# Patient Record
Sex: Female | Born: 1970 | Race: White | Hispanic: No | Marital: Married | Smoking: Never smoker
Health system: Southern US, Community
[De-identification: ages and names within clinical notes are randomized; demographics above are authoritative.]

---

## 2020-04-08 ENCOUNTER — Ambulatory Visit (INDEPENDENT_AMBULATORY_CARE_PROVIDER_SITE_OTHER): Payer: BC Managed Care – PPO | Admitting: Plastic Surgery

## 2020-04-08 ENCOUNTER — Other Ambulatory Visit: Payer: Self-pay

## 2020-04-08 ENCOUNTER — Ambulatory Visit
Admission: RE | Admit: 2020-04-08 | Discharge: 2020-04-08 | Disposition: A | Discharge status: Home / Self Care | Payer: BC Managed Care – PPO | Source: Ambulatory Visit | Attending: Plastic Surgery | Admitting: Plastic Surgery

## 2020-04-08 ENCOUNTER — Encounter: Payer: Self-pay | Admitting: Plastic Surgery

## 2020-04-08 VITALS — BP 125/82 | HR 76 | Temp 98.1°F | Ht 63.0 in | Wt 145.0 lb

## 2020-04-08 DIAGNOSIS — S70352A Superficial foreign body, left thigh, initial encounter: Secondary | ICD-10-CM

## 2020-04-08 NOTE — Progress Notes (Signed)
   Referring Provider No referring provider defined for this encounter.   CC:  Chief Complaint  Patient presents with  . Procedure      Anita Wilcox is an 50 y.o. female.  HPI: Patient presents with concerns of a foreign body in her left thigh.  She fell on some glass about 30 years ago and has had a number of pieces removed over time.  She feels like she has a specific area of pain in the medial thigh and wants to have it evaluated.  She says she has had x-rays done in the past which showed retained foreign bodies but they were not extracted because they have become symptomatic.  Not on File  No outpatient encounter medications on file as of 04/08/2020.   No facility-administered encounter medications on file as of 04/08/2020.     No past medical history on file.  No family history on file.  Social History   Social History Narrative  . Not on file     Review of Systems General: Denies fevers, chills, weight loss CV: Denies chest pain, shortness of breath, palpitations  Physical Exam No flowsheet data found.  General:  No acute distress,  Alert and oriented, Non-Toxic, Normal speech and affect Examination shows a scar on the posterior medial and anteromedial aspect of the thigh.  She says the glass entered and posteriorly and ultimately a piece was removed anteriorly years ago.  The focal spot of tenderness is directly between the 2 but I am unable to palpate anything obvious.  There is no visual evidence of anything in that location either.  Assessment/Plan Patient presents with suspected foreign body in the left thigh.  It seems like this is becoming symptomatic.  I would like to get an ultrasound to help localize this and define if there is more than 1 piece.  We will send her for this and then if it looks amenable to removal we will move forward with that.  All her questions were answered.  Allena Napoleon 04/08/2020, 8:44 AM

## 2020-05-23 ENCOUNTER — Telehealth: Payer: Self-pay

## 2020-05-23 NOTE — Telephone Encounter (Signed)
Patient called to say that she has had her ultrasound and she would like to know what her next step is.  Please call.

## 2020-06-06 ENCOUNTER — Other Ambulatory Visit: Payer: Self-pay

## 2020-06-06 ENCOUNTER — Other Ambulatory Visit: Payer: Self-pay | Admitting: Plastic Surgery

## 2020-06-06 DIAGNOSIS — M795 Residual foreign body in soft tissue: Secondary | ICD-10-CM

## 2020-06-10 ENCOUNTER — Other Ambulatory Visit: Payer: Self-pay | Admitting: Surgical

## 2020-06-10 DIAGNOSIS — S70352A Superficial foreign body, left thigh, initial encounter: Secondary | ICD-10-CM

## 2020-06-27 ENCOUNTER — Other Ambulatory Visit: Payer: Self-pay | Admitting: Surgical

## 2020-06-27 DIAGNOSIS — S70352A Superficial foreign body, left thigh, initial encounter: Secondary | ICD-10-CM

## 2020-07-30 ENCOUNTER — Other Ambulatory Visit: Payer: Self-pay | Admitting: Family Medicine

## 2020-07-30 DIAGNOSIS — S70352D Superficial foreign body, left thigh, subsequent encounter: Secondary | ICD-10-CM

## 2020-07-30 DIAGNOSIS — M79652 Pain in left thigh: Secondary | ICD-10-CM

## 2020-08-06 ENCOUNTER — Ambulatory Visit
Admission: RE | Admit: 2020-08-06 | Discharge: 2020-08-06 | Disposition: A | Discharge status: Home / Self Care | Payer: BC Managed Care – PPO | Source: Ambulatory Visit | Attending: Family Medicine | Admitting: Family Medicine

## 2020-08-06 DIAGNOSIS — M79652 Pain in left thigh: Secondary | ICD-10-CM

## 2020-08-06 DIAGNOSIS — S70352D Superficial foreign body, left thigh, subsequent encounter: Secondary | ICD-10-CM

## 2021-06-09 ENCOUNTER — Other Ambulatory Visit: Payer: Self-pay | Admitting: Surgical

## 2021-06-09 DIAGNOSIS — S70352A Superficial foreign body, left thigh, initial encounter: Secondary | ICD-10-CM

## 2023-01-03 IMAGING — CT CT FEMUR *L* W/O CM
3 of 4 series · 10 of 33 positions shown, 12 images · non-contrast
Comparison: None.

CLINICAL DATA: Pain with pressure on the leg. Just below the
buttocks.

EXAM:
CT OF THE LOWER LEFT EXTREMITY WITHOUT CONTRAST
TECHNIQUE: Multidetector CT imaging of the lower left extremity was performed
according to the standard protocol.

[Series 9: lfov lower extremity 2.00 br40 s3 soft · coronal · 0.42mm/px · 3 of 139 slices shown (1 of 2)]
[im 28/139  bone]
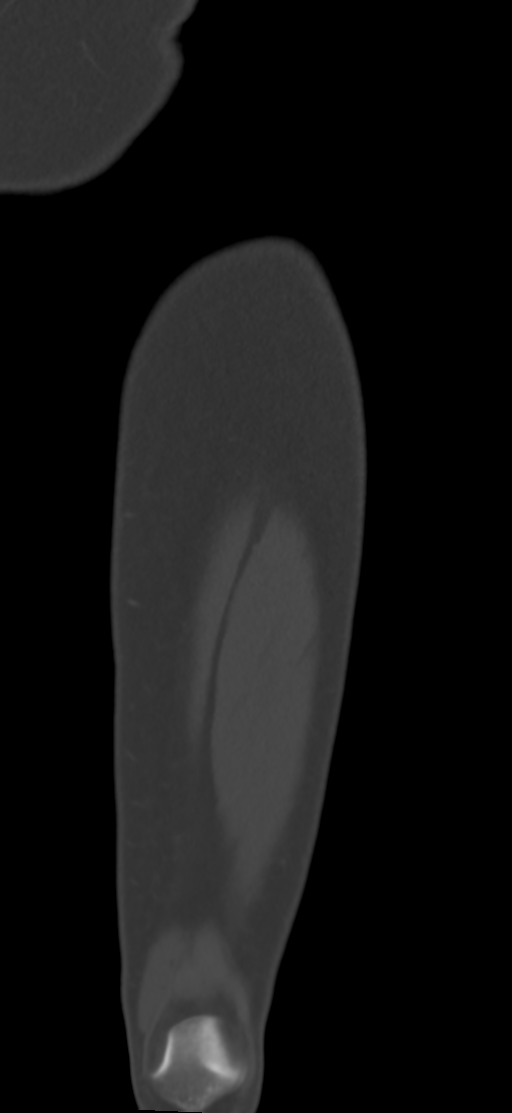
[im 56/139  bone]
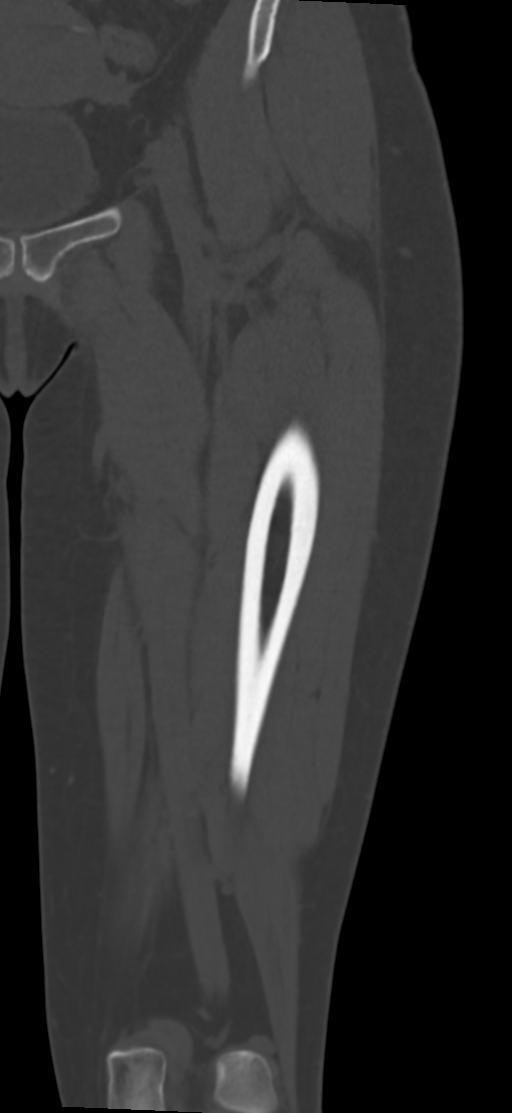
[im 83/139  bone]
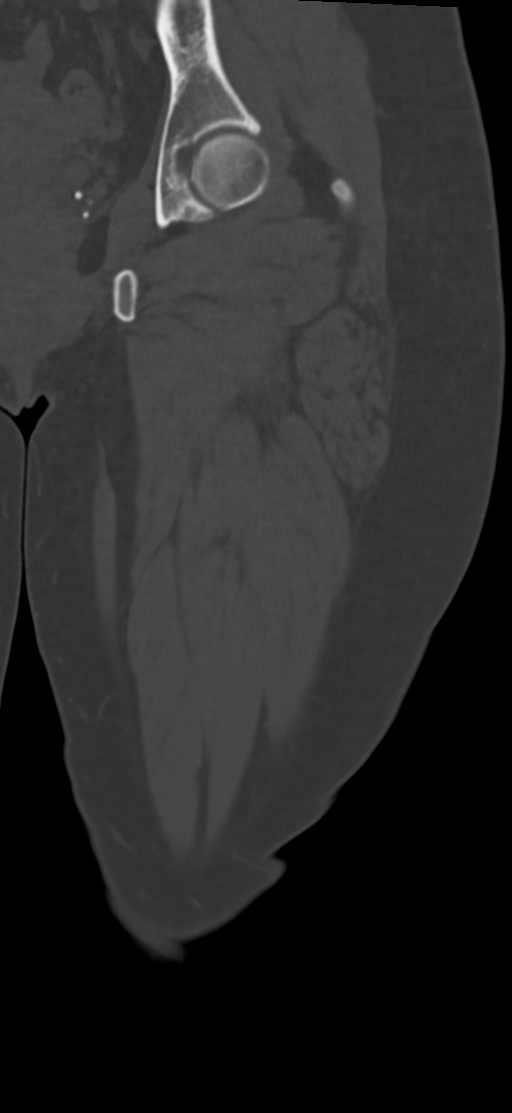

[Series 13: lfov lower extremity 2.00 br40 s3 soft · sagittal · 0.54mm/px · 5 of 108 slices shown, 6 images (2 of 2)]
[im 36/108  bone]
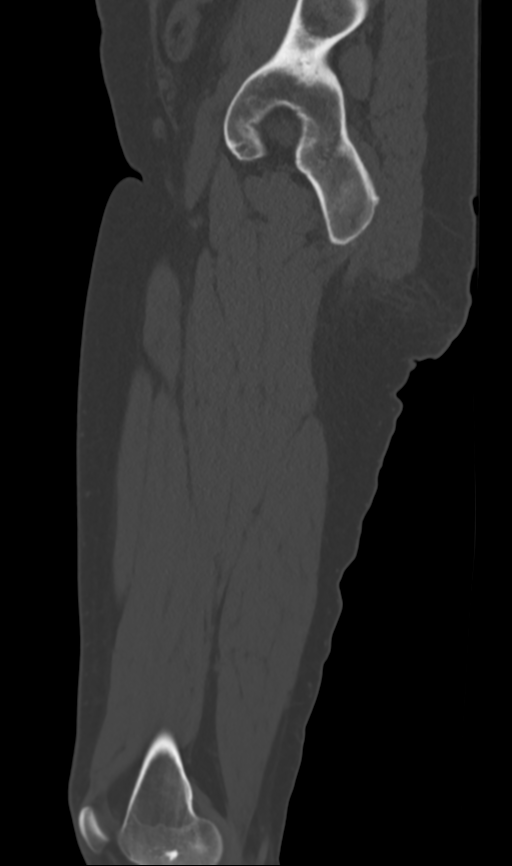
[im 45/108  bone]
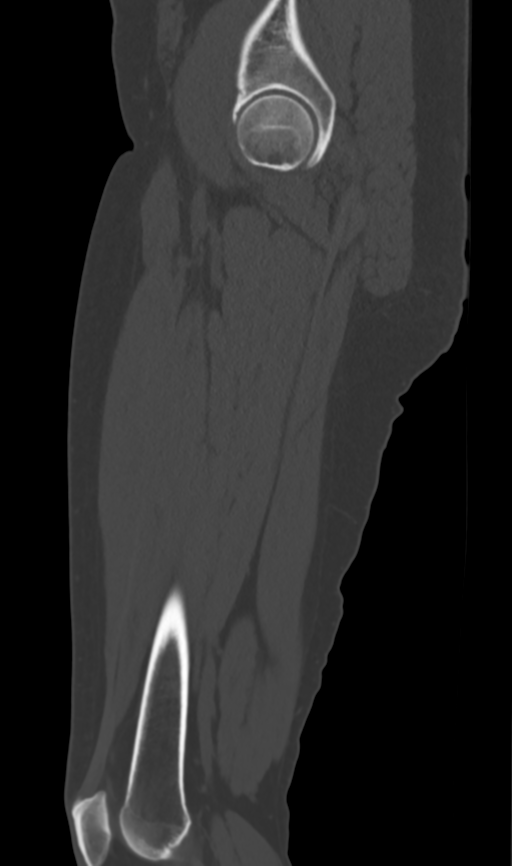
[im 54/108  soft-tissue]
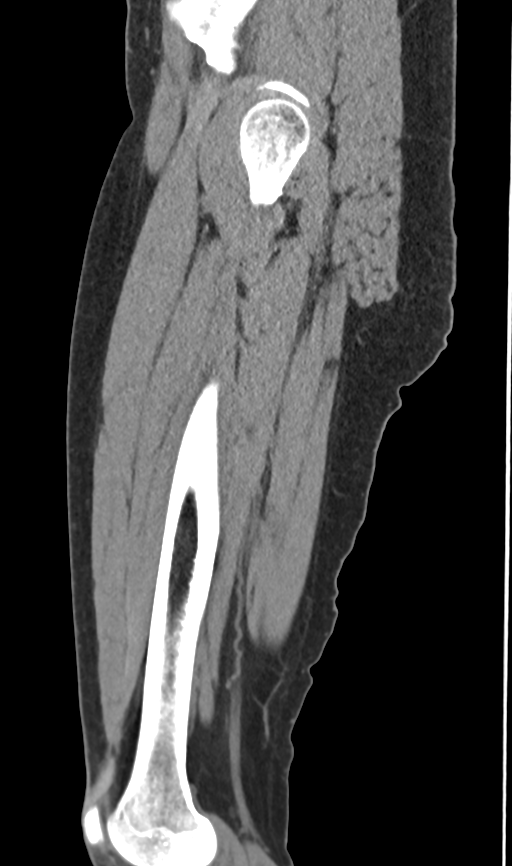
[im 54/108  bone]
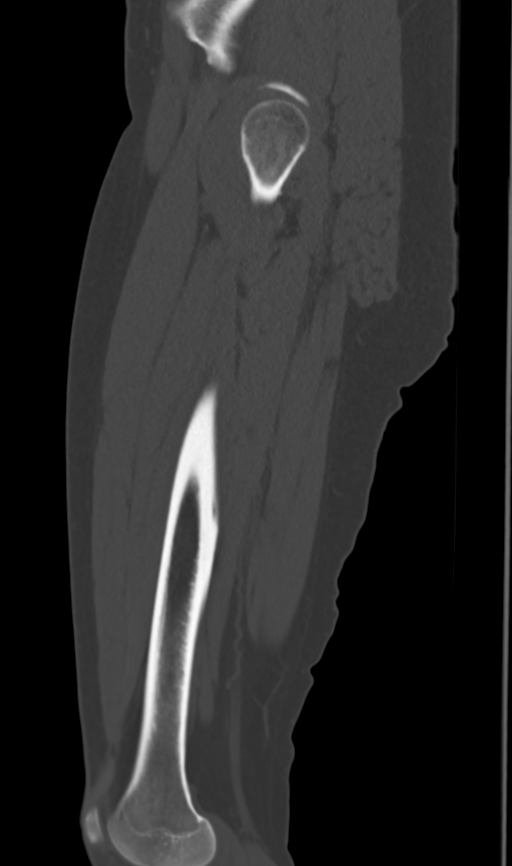
[im 63/108  bone]
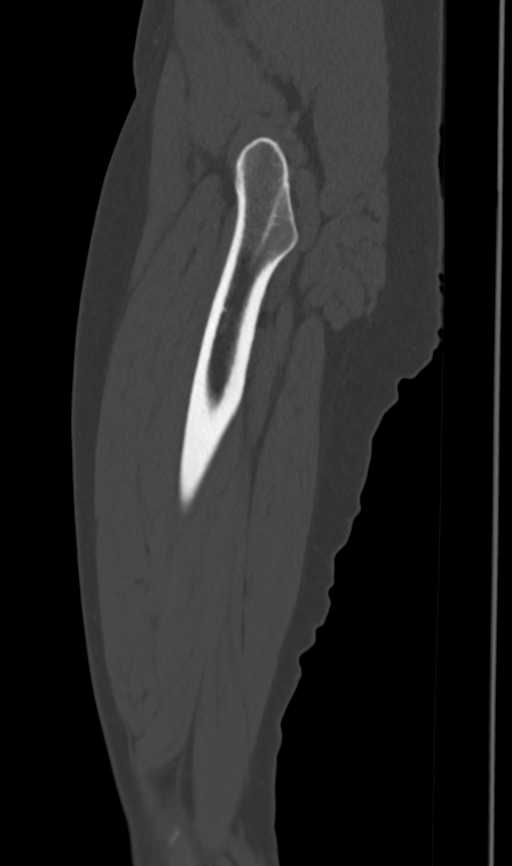
[im 72/108  bone]
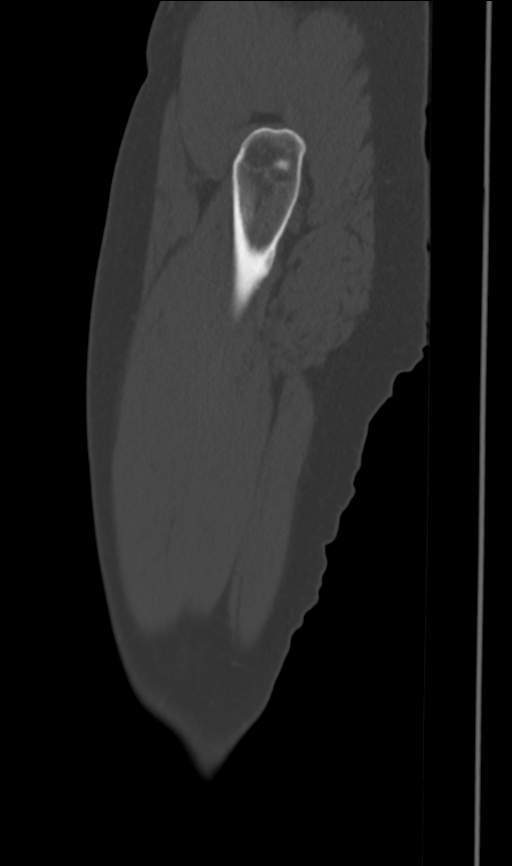

[Series 16: lfov lower extremity 0.60 br40 s3 soft thin · axial · 0.55mm/px · z∈[+1144,+1332]mm · 2 of 784 slices shown, 3 images]
[im 235/784  soft-tissue]
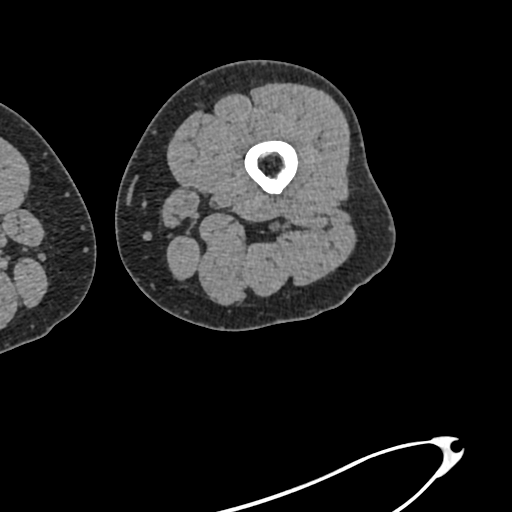
[im 235/784  bone]
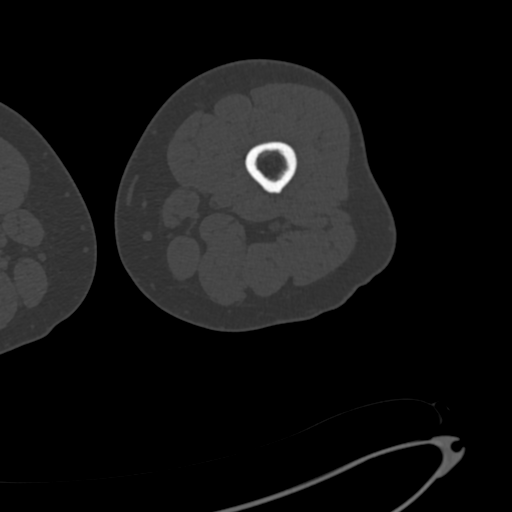
[im 549/784  bone]
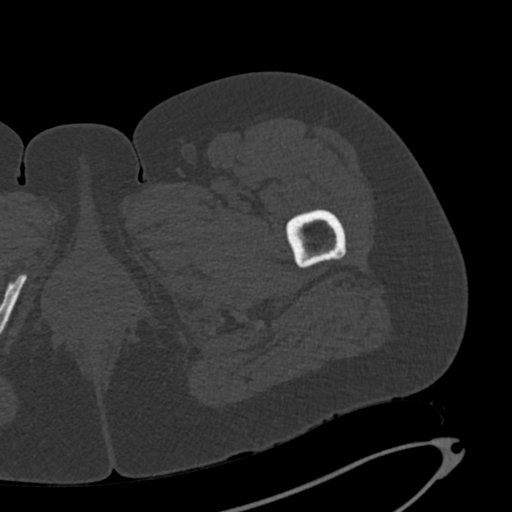

[10 of 33 positions shown; findings below may reference images not displayed]

FINDINGS: Bones/Joint/Cartilage

No fracture or dislocation. Normal alignment. No joint effusion.

Ligaments

Ligaments are suboptimally evaluated by CT.

Muscles and Tendons
7 mm calcification in the left adductor longus muscle likely related
to prior trauma. Muscles are otherwise normal. No muscle atrophy. No
intramuscular fluid collection or hematoma.

Soft tissue
No fluid collection or hematoma. No soft tissue mass. No radiopaque
foreign body.
IMPRESSION: 1. No acute osseous injury of the left lower extremity.
2. A 7 mm calcification in the left adductor longus muscle likely
related to prior trauma.
3. No radiopaque foreign body in the left leg.
